# Patient Record
Sex: Female | Born: 1960 | Smoking: Never smoker
Health system: Southern US, Community
[De-identification: ages and names within clinical notes are randomized; demographics above are authoritative.]

## PROBLEM LIST (undated history)

## (undated) DIAGNOSIS — Z79899 Other long term (current) drug therapy: Secondary | ICD-10-CM

## (undated) DIAGNOSIS — J9611 Chronic respiratory failure with hypoxia: Secondary | ICD-10-CM

## (undated) DIAGNOSIS — J84112 Idiopathic pulmonary fibrosis: Secondary | ICD-10-CM

## (undated) HISTORY — DX: Chronic respiratory failure with hypoxia: J96.11

## (undated) HISTORY — DX: Idiopathic pulmonary fibrosis: J84.112

## (undated) HISTORY — DX: Other long term (current) drug therapy: Z79.899

---

## 2020-05-22 DIAGNOSIS — I5020 Unspecified systolic (congestive) heart failure: Secondary | ICD-10-CM | POA: Diagnosis not present

## 2020-05-22 DIAGNOSIS — J9621 Acute and chronic respiratory failure with hypoxia: Secondary | ICD-10-CM | POA: Diagnosis not present

## 2020-05-22 DIAGNOSIS — U071 COVID-19: Secondary | ICD-10-CM | POA: Diagnosis not present

## 2020-05-23 DIAGNOSIS — I5020 Unspecified systolic (congestive) heart failure: Secondary | ICD-10-CM | POA: Diagnosis not present

## 2020-05-23 DIAGNOSIS — J9621 Acute and chronic respiratory failure with hypoxia: Secondary | ICD-10-CM | POA: Diagnosis not present

## 2020-05-23 DIAGNOSIS — U071 COVID-19: Secondary | ICD-10-CM | POA: Diagnosis not present

## 2020-05-24 DIAGNOSIS — I5020 Unspecified systolic (congestive) heart failure: Secondary | ICD-10-CM | POA: Diagnosis not present

## 2020-05-24 DIAGNOSIS — U071 COVID-19: Secondary | ICD-10-CM | POA: Diagnosis not present

## 2020-05-24 DIAGNOSIS — J9621 Acute and chronic respiratory failure with hypoxia: Secondary | ICD-10-CM | POA: Diagnosis not present

## 2020-05-25 DIAGNOSIS — I5020 Unspecified systolic (congestive) heart failure: Secondary | ICD-10-CM | POA: Diagnosis not present

## 2020-05-25 DIAGNOSIS — U071 COVID-19: Secondary | ICD-10-CM | POA: Diagnosis not present

## 2020-05-25 DIAGNOSIS — J9621 Acute and chronic respiratory failure with hypoxia: Secondary | ICD-10-CM | POA: Diagnosis not present

## 2020-06-04 DIAGNOSIS — U071 COVID-19: Secondary | ICD-10-CM | POA: Diagnosis not present

## 2020-06-04 DIAGNOSIS — I5020 Unspecified systolic (congestive) heart failure: Secondary | ICD-10-CM | POA: Diagnosis not present

## 2020-06-04 DIAGNOSIS — J9621 Acute and chronic respiratory failure with hypoxia: Secondary | ICD-10-CM | POA: Diagnosis not present

## 2020-06-05 DIAGNOSIS — I5020 Unspecified systolic (congestive) heart failure: Secondary | ICD-10-CM | POA: Diagnosis not present

## 2020-06-05 DIAGNOSIS — U071 COVID-19: Secondary | ICD-10-CM | POA: Diagnosis not present

## 2020-06-05 DIAGNOSIS — J9621 Acute and chronic respiratory failure with hypoxia: Secondary | ICD-10-CM | POA: Diagnosis not present

## 2020-06-06 DIAGNOSIS — I5020 Unspecified systolic (congestive) heart failure: Secondary | ICD-10-CM | POA: Diagnosis not present

## 2020-06-06 DIAGNOSIS — J9621 Acute and chronic respiratory failure with hypoxia: Secondary | ICD-10-CM | POA: Diagnosis not present

## 2020-06-06 DIAGNOSIS — U071 COVID-19: Secondary | ICD-10-CM | POA: Diagnosis not present

## 2020-06-07 DIAGNOSIS — I5020 Unspecified systolic (congestive) heart failure: Secondary | ICD-10-CM | POA: Diagnosis not present

## 2020-06-07 DIAGNOSIS — J9621 Acute and chronic respiratory failure with hypoxia: Secondary | ICD-10-CM | POA: Diagnosis not present

## 2020-06-07 DIAGNOSIS — U071 COVID-19: Secondary | ICD-10-CM | POA: Diagnosis not present

## 2020-06-08 DIAGNOSIS — I5020 Unspecified systolic (congestive) heart failure: Secondary | ICD-10-CM | POA: Diagnosis not present

## 2020-06-08 DIAGNOSIS — U071 COVID-19: Secondary | ICD-10-CM | POA: Diagnosis not present

## 2020-06-08 DIAGNOSIS — J9621 Acute and chronic respiratory failure with hypoxia: Secondary | ICD-10-CM | POA: Diagnosis not present

## 2020-06-09 DIAGNOSIS — I5020 Unspecified systolic (congestive) heart failure: Secondary | ICD-10-CM | POA: Diagnosis not present

## 2020-06-09 DIAGNOSIS — U071 COVID-19: Secondary | ICD-10-CM | POA: Diagnosis not present

## 2020-06-09 DIAGNOSIS — J9621 Acute and chronic respiratory failure with hypoxia: Secondary | ICD-10-CM | POA: Diagnosis not present

## 2020-06-10 DIAGNOSIS — I5020 Unspecified systolic (congestive) heart failure: Secondary | ICD-10-CM | POA: Diagnosis not present

## 2020-06-10 DIAGNOSIS — U071 COVID-19: Secondary | ICD-10-CM | POA: Diagnosis not present

## 2020-06-10 DIAGNOSIS — J9621 Acute and chronic respiratory failure with hypoxia: Secondary | ICD-10-CM | POA: Diagnosis not present

## 2020-06-18 DIAGNOSIS — I5020 Unspecified systolic (congestive) heart failure: Secondary | ICD-10-CM | POA: Diagnosis not present

## 2020-06-18 DIAGNOSIS — J9621 Acute and chronic respiratory failure with hypoxia: Secondary | ICD-10-CM | POA: Diagnosis not present

## 2020-06-18 DIAGNOSIS — U071 COVID-19: Secondary | ICD-10-CM | POA: Diagnosis not present

## 2020-06-19 DIAGNOSIS — I5020 Unspecified systolic (congestive) heart failure: Secondary | ICD-10-CM | POA: Diagnosis not present

## 2020-06-19 DIAGNOSIS — U071 COVID-19: Secondary | ICD-10-CM | POA: Diagnosis not present

## 2020-06-19 DIAGNOSIS — J9621 Acute and chronic respiratory failure with hypoxia: Secondary | ICD-10-CM | POA: Diagnosis not present

## 2020-06-20 DIAGNOSIS — U071 COVID-19: Secondary | ICD-10-CM | POA: Diagnosis not present

## 2020-06-20 DIAGNOSIS — J9621 Acute and chronic respiratory failure with hypoxia: Secondary | ICD-10-CM | POA: Diagnosis not present

## 2020-06-20 DIAGNOSIS — I5020 Unspecified systolic (congestive) heart failure: Secondary | ICD-10-CM | POA: Diagnosis not present

## 2020-06-21 DIAGNOSIS — J9621 Acute and chronic respiratory failure with hypoxia: Secondary | ICD-10-CM | POA: Diagnosis not present

## 2020-06-21 DIAGNOSIS — U071 COVID-19: Secondary | ICD-10-CM | POA: Diagnosis not present

## 2020-06-21 DIAGNOSIS — I5020 Unspecified systolic (congestive) heart failure: Secondary | ICD-10-CM | POA: Diagnosis not present

## 2020-07-16 DIAGNOSIS — J9621 Acute and chronic respiratory failure with hypoxia: Secondary | ICD-10-CM | POA: Diagnosis not present

## 2020-07-16 DIAGNOSIS — U071 COVID-19: Secondary | ICD-10-CM | POA: Diagnosis not present

## 2020-07-16 DIAGNOSIS — I5020 Unspecified systolic (congestive) heart failure: Secondary | ICD-10-CM | POA: Diagnosis not present

## 2020-07-17 DIAGNOSIS — U071 COVID-19: Secondary | ICD-10-CM | POA: Diagnosis not present

## 2020-07-17 DIAGNOSIS — J9621 Acute and chronic respiratory failure with hypoxia: Secondary | ICD-10-CM | POA: Diagnosis not present

## 2020-07-17 DIAGNOSIS — I5020 Unspecified systolic (congestive) heart failure: Secondary | ICD-10-CM | POA: Diagnosis not present

## 2020-07-18 DIAGNOSIS — I5020 Unspecified systolic (congestive) heart failure: Secondary | ICD-10-CM | POA: Diagnosis not present

## 2020-07-18 DIAGNOSIS — J9621 Acute and chronic respiratory failure with hypoxia: Secondary | ICD-10-CM | POA: Diagnosis not present

## 2020-07-18 DIAGNOSIS — U071 COVID-19: Secondary | ICD-10-CM | POA: Diagnosis not present

## 2020-07-19 DIAGNOSIS — U071 COVID-19: Secondary | ICD-10-CM | POA: Diagnosis not present

## 2020-07-19 DIAGNOSIS — J9621 Acute and chronic respiratory failure with hypoxia: Secondary | ICD-10-CM | POA: Diagnosis not present

## 2020-07-19 DIAGNOSIS — I5020 Unspecified systolic (congestive) heart failure: Secondary | ICD-10-CM | POA: Diagnosis not present

## 2020-11-02 ENCOUNTER — Ambulatory Visit: Payer: Medicare HMO | Admitting: Pulmonary Disease

## 2020-11-02 ENCOUNTER — Encounter: Payer: Self-pay | Admitting: Pulmonary Disease

## 2020-11-02 ENCOUNTER — Telehealth: Payer: Self-pay | Admitting: Pulmonary Disease

## 2020-11-02 ENCOUNTER — Other Ambulatory Visit: Payer: Self-pay

## 2020-11-02 VITALS — BP 118/68 | HR 85 | Temp 97.6°F | Ht 66.5 in | Wt 145.2 lb

## 2020-11-02 DIAGNOSIS — R0600 Dyspnea, unspecified: Secondary | ICD-10-CM

## 2020-11-02 DIAGNOSIS — U099 Post covid-19 condition, unspecified: Secondary | ICD-10-CM | POA: Diagnosis not present

## 2020-11-02 DIAGNOSIS — G4733 Obstructive sleep apnea (adult) (pediatric): Secondary | ICD-10-CM

## 2020-11-02 NOTE — Patient Instructions (Signed)
I have reviewed your hospital course I am glad that you have improved Continue oxygen for now Stop Eliquis Thank you needed going forward  Schedule high-resolution CT and lung function test Follow-up in 1 to 2 months.

## 2020-11-02 NOTE — Telephone Encounter (Signed)
Left message for patient to call back  

## 2020-11-02 NOTE — Progress Notes (Signed)
Tracy Pace    [id number]    1961/05/13  Primary Care Physician:Kilpatrick, Greggory Stallion, MD  Referring Physician: Corine Shelter, MD [address] [place],  [place] [address]  Chief complaint: Consult for post COVID-34  HPI: 60 year old with hypertension, breast cancer in 2018 status postmastectomy, chronic headaches, OSA not on CPAP, depression  She was admitted to [facility] for pneumonia due to COVID-19 for approximately 46 days. Treated initially with steroids, remdesivir.  She had recurrent septic episodes with C. difficile, multilobar Pseudomonas pneumonia, progressive respiratory failure requiring intubation.  Ultimately had a trach placed on 05/09/2021.also had a PICC line associated right IJ DVT for which she was started on anticoagulation with heparin > transition to Eliquis.  CTA 04/03/2020 with no pulmonary embolism.  She was discharged on 05/18/2020 to [facility] and was eventually decannulated.  Discharged to home on 07/19/2021 with a Luisa device  Post discharge she has continued to make slow improvements.  Remains on supplemental oxygen at 3 and Luisa home ventilator at night.  She wants to know if she can come off some of the medications that she is on and come off the nocturnal vent, home oxygen  Pets: No pets Occupation: Disabled Exposures: No exposures Smoking history: Remote smoking history Travel history: No significant travel history Relevant family history: No family issue of lung disease  Outpatient Encounter Medications as of 11/02/2020  Medication Sig  . amiodarone (PACERONE) 100 MG tablet Take 100 mg by mouth daily.  Marland Kitchen apixaban (ELIQUIS) 5 MG TABS tablet Take 5 mg by mouth 2 (two) times daily.  . busPIRone (BUSPAR) 15 MG tablet Take by mouth.  . cetirizine (ZYRTEC) 10 MG tablet Take 10 mg by mouth daily.  . clonazePAM (KLONOPIN) 0.5 MG tablet Take 1 tablet by mouth every 8 (eight) hours as needed.  . clonazePAM (KLONOPIN) 0.5 MG tablet  Take 0.5 mg by mouth at bedtime.  . gabapentin (NEURONTIN) 300 MG capsule Give by tube.  . midodrine (PROAMATINE) 5 MG tablet Take by mouth.  . pantoprazole (PROTONIX) 40 MG tablet Take by mouth.   No facility-administered encounter medications on file as of 11/02/2020.    Allergies as of 11/02/2020  . (No Known Allergies)    No past medical history on file.  History reviewed. No pertinent surgical history.  No family history on file.  Social History   Socioeconomic History  . Marital status: Unknown    Spouse name: Not on file  . Number of children: Not on file  . Years of education: Not on file  . Highest education level: Not on file  Occupational History  . Not on file  Tobacco Use  . Smoking status: Never Smoker  . Smokeless tobacco: Never Used  Substance and Sexual Activity  . Alcohol use: Not on file  . Drug use: Not on file  . Sexual activity: Not on file  Other Topics Concern  . Not on file  Social History Narrative  . Not on file   Social Determinants of Health   Financial Resource Strain: Not on file  Food Insecurity: Not on file  Transportation Needs: Not on file  Physical Activity: Not on file  Stress: Not on file  Social Connections: Not on file  Intimate Partner Violence: Not on file    Review of systems: Review of Systems  Constitutional: Negative for fever and chills.  HENT: Negative.   Eyes: Negative for blurred vision.  Respiratory: as per HPI  Cardiovascular: Negative for chest pain and palpitations.  Gastrointestinal: Negative for vomiting, diarrhea, blood per rectum. Genitourinary: Negative for dysuria, urgency, frequency and hematuria.  Musculoskeletal: Negative for myalgias, back pain and joint pain.  Skin: Negative for itching and rash.  Neurological: Negative for dizziness, tremors, focal weakness, seizures and loss of consciousness.  Endo/Heme/Allergies: Negative for environmental allergies.  Psychiatric/Behavioral: Negative for  depression, suicidal ideas and hallucinations.  All other systems reviewed and are negative.  Physical Exam: Blood pressure 118/68, pulse 85, temperature 97.6 F (36.4 C), temperature source Temporal, height 5' 6.5" (1.689 m), weight 145 lb 3.2 oz (65.9 kg), SpO2 100 %. Gen:      No acute distress HEENT:  EOMI, sclera anicteric Neck:     No masses; no thyromegaly Lungs:    Clear to auscultation bilaterally; normal respiratory effort CV:         Regular rate and rhythm; no murmurs Abd:      + bowel sounds; soft, non-tender; no palpable masses, no distension Ext:    No edema; adequate peripheral perfusion Skin:      Warm and dry; no rash Neuro: alert and oriented x 3 Psych: normal mood and affect  Data Reviewed: Imaging: CT chest abdomen pelvis from [facility] 04/19/2020- 1. Subcutaneous emphysema neck and superior anterior chest. ETT in place.  2. Pneumomediastinum. No definite pneumothorax identified.  3. Mild increased groundglass opacities both lungs.  4. Nonspecific bowel gas pattern.  5. Couple of smallright renal stones.   Chest x-ray 05/17/2020-from [facility] 1. Stable severe bilateral lung infiltrate   PFTs:  Labs:  Cardiac: Echocardiogram 04/27/2020 from [facility] LeftVentricle: EF: 55-60%.  . Pericardium: There is no pericardial effusion.  . RightVentricle: Right ventricle is moderately dilated.  . RightVentricle: Systolic function is low normal.   Assessment:  Post COVID-19 She had a prolonged hospitalization and recovery requiring a tracheostomy Now decannulated and appears to be making slow steady progress She desatted on exertion today and will need to continue supplemental oxygen 3 L at night and with exertion  She is on [place] home vent with AVAPS and high flow setting.  She is using this intermittently and wants to get off it.  She may have needed it at discharge due to severe post covid lung disease but may be able to be weaned off.  I spoke with [name] from  [facility] who will fax a download for me to review We will schedule high-res CT and PFTs.  Based on this tests we may place an order for discontinuation of the home vent and discontinue supplemental oxygen at night  She wants to get off her other medications which include amiodarone, Revatio, Eliquis. We can stop the Eliquis as it was started in the setting of PICC line associated DVT in September during hospitalization for Covid.  I am not sure about her other medications as they were started in [facility].  She will try to get me [facility] records for review  OSA Noncompliant with CPAP.  Will need to review at return visit  Plan/Recommendations: High-res CT, PFTs Review home and download Discontinue Eliquis  Chilton Greathouse MD [facility] 11/02/2020, 11:21 AM  CC: Corine Shelter, MD

## 2020-11-06 NOTE — Telephone Encounter (Signed)
ATC pt, VM box is full. WCB. 

## 2020-11-08 NOTE — Telephone Encounter (Signed)
Called and spoke to pt. Pt requesting a POC. Pt currently uses e-tanks at 3lpm. Pt uses [facility]. Pt last seen on 11/02/2020.   Dr. Isaiah Serge, please advise if ok to place order to eval/provider pt with POC. Thanks.

## 2020-11-12 NOTE — Telephone Encounter (Signed)
Called and spoke with patient. She stated that she does not believe she received the machine from [employer]. The person who setup her machine is named [name] and he can be reached at [phone]. Advised her that I would call to see if we could get a download.   [employer]. He stated that he faxed over a copy of the download last week to Dr. Shirlee More attention. He will send another copy just in case.   Will keep encounter open on follow up for download.

## 2020-11-12 NOTE — Telephone Encounter (Signed)
I am waiting for the download from the DME company to decide on the Luisa vent. Will update her when I review this

## 2020-11-12 NOTE — Telephone Encounter (Signed)
Okay to place order for POC

## 2020-11-12 NOTE — Telephone Encounter (Signed)
Called and spoke with patient. She verbalized understanding of POC order to [facility]. While on the phone, she wanted to know if she needed to continue the [place] machine (ventilator). Reviewed her last OV and it stated that she was noncompliant and this would be addressed at her next OV. I advised her for now she would need to remain on the machine until she hears back from [place]. She verbalized understanding.   Dr. Isaiah Serge, can you advise about the ventilator. Thanks!

## 2020-11-13 NOTE — Telephone Encounter (Signed)
Misty Stanley, please advise if this has been received for Dr. Isaiah Serge. Thanks.

## 2020-11-13 NOTE — Telephone Encounter (Signed)
I have not received a fax at this time on patient. I called [name], fax number given. [name] is Diplomatic Services operational officer over to office in the morning, 11/14/20,  attention Misty Stanley. I will follow up once fax is received.

## 2020-11-14 NOTE — Telephone Encounter (Signed)
Fax received and placed with Dr. Shirlee More review.

## 2020-11-19 ENCOUNTER — Inpatient Hospital Stay: Admission: RE | Admit: 2020-11-19 | Payer: Medicare HMO | Source: Ambulatory Visit

## 2020-11-19 NOTE — Telephone Encounter (Signed)
Called and spoke with [name], he stated he has not received the paperwork back from Dr. Isaiah Serge.  Stated that if Dr. Isaiah Serge decides to d/c the machine to let him know.  Misty Stanley, please advise if Dr. Isaiah Serge has filled out the paperwork for this patient.  Please call [name] if Dr. Isaiah Serge wants to d/c the machine.  Thank you.

## 2020-11-19 NOTE — Telephone Encounter (Signed)
Per 11/14/20 encounter- I received fax and it was placed in Dr. Shirlee More review. I have not received it back from Dr. Isaiah Serge or faxed it to DME.

## 2020-11-27 NOTE — Telephone Encounter (Signed)
I called [name] at [place] to follow up on fax.  Fax was received and [name]  stated he has not received anything back at this time.[name] stated to fax if Dr. Isaiah Serge decides on d/c order.

## 2020-12-03 ENCOUNTER — Inpatient Hospital Stay: Admission: RE | Admit: 2020-12-03 | Payer: Medicare HMO | Source: Ambulatory Visit

## 2020-12-07 NOTE — Telephone Encounter (Signed)
I called and spoke with patient regarding POC machine. Informed patient that order was sent to [facility] on 11/22/2020 and we arent sure how long it will take them but need to reach out to them. Patient will also need a qual walk on POC for insurance to cover it. Patient also needed f/u OV with pft. I scheduled the patient for pft on 12/27/20 with OV with Tammy Parrett at 4pm with walk on POC. I also scheduled covid test. Patient verbalized understanding, nothing further needed.

## 2020-12-07 NOTE — Telephone Encounter (Signed)
Pt called me (she had my phone # from when I scheduled CT for her) because she has not heard anything back regarding getting poc.

## 2020-12-11 ENCOUNTER — Ambulatory Visit: Payer: Medicare HMO | Admitting: Primary Care

## 2020-12-14 ENCOUNTER — Ambulatory Visit: Payer: Medicare HMO | Admitting: Adult Health

## 2020-12-21 ENCOUNTER — Inpatient Hospital Stay: Admission: RE | Admit: 2020-12-21 | Payer: Medicare HMO | Source: Ambulatory Visit

## 2020-12-25 ENCOUNTER — Other Ambulatory Visit (HOSPITAL_COMMUNITY): Payer: Medicare HMO

## 2020-12-27 ENCOUNTER — Ambulatory Visit: Payer: Medicare HMO | Admitting: Adult Health

## 2021-01-17 ENCOUNTER — Inpatient Hospital Stay: Admission: RE | Admit: 2021-01-17 | Payer: Medicare HMO | Source: Ambulatory Visit

## 2021-01-31 ENCOUNTER — Ambulatory Visit: Payer: Medicare HMO | Admitting: Adult Health

## 2021-01-31 ENCOUNTER — Encounter: Payer: Medicare HMO | Admitting: Adult Health

## 2021-02-01 ENCOUNTER — Inpatient Hospital Stay: Admission: RE | Admit: 2021-02-01 | Payer: Medicare HMO | Source: Ambulatory Visit

## 2021-02-14 ENCOUNTER — Inpatient Hospital Stay: Admission: RE | Admit: 2021-02-14 | Payer: Medicare HMO | Source: Ambulatory Visit

## 2021-02-21 ENCOUNTER — Inpatient Hospital Stay: Admission: RE | Admit: 2021-02-21 | Payer: Medicare HMO | Source: Ambulatory Visit

## 2021-03-01 ENCOUNTER — Inpatient Hospital Stay: Admission: RE | Admit: 2021-03-01 | Payer: Medicare HMO | Source: Ambulatory Visit

## 2021-03-01 ENCOUNTER — Ambulatory Visit: Payer: Medicare HMO | Admitting: Adult Health

## 2021-03-12 ENCOUNTER — Ambulatory Visit (INDEPENDENT_AMBULATORY_CARE_PROVIDER_SITE_OTHER)
Admission: RE | Admit: 2021-03-12 | Discharge: 2021-03-12 | Disposition: A | Discharge status: Home / Self Care | Payer: Medicare HMO | Source: Ambulatory Visit | Attending: Pulmonary Disease | Admitting: Pulmonary Disease

## 2021-03-12 ENCOUNTER — Other Ambulatory Visit: Payer: Self-pay

## 2021-03-12 DIAGNOSIS — R0600 Dyspnea, unspecified: Secondary | ICD-10-CM

## 2021-03-21 ENCOUNTER — Telehealth: Payer: Self-pay | Admitting: Pulmonary Disease

## 2021-03-21 NOTE — Telephone Encounter (Signed)
Call made to patient, confirmed DOB. Made aware of CT results per PM:    Voiced understanding. Nothing further needed at this time.

## 2021-03-28 ENCOUNTER — Ambulatory Visit: Payer: Medicare HMO | Admitting: Adult Health

## 2021-04-22 ENCOUNTER — Ambulatory Visit: Payer: Medicare HMO | Admitting: Adult Health

## 2021-04-22 ENCOUNTER — Telehealth: Payer: Self-pay | Admitting: Pulmonary Disease

## 2021-04-22 DIAGNOSIS — G4733 Obstructive sleep apnea (adult) (pediatric): Secondary | ICD-10-CM

## 2021-04-22 DIAGNOSIS — U099 Post covid-19 condition, unspecified: Secondary | ICD-10-CM

## 2021-04-22 DIAGNOSIS — R0600 Dyspnea, unspecified: Secondary | ICD-10-CM

## 2021-04-22 NOTE — Telephone Encounter (Signed)
Called and spoke with patient who states that she needs an order sent to [facility] so that they can come and pick up her oxygen because she has not used it in months now.   Dr. Isaiah Serge please advise if you are ok with [place] placing order

## 2021-04-23 NOTE — Telephone Encounter (Signed)
Please advise if you are ok with [place] putting in order to D/C oxygen

## 2021-04-23 NOTE — Telephone Encounter (Signed)
Patient states fax number for [facility] is [phone]. Discharge on NIV. Patient phone number is [phone].

## 2021-04-24 NOTE — Telephone Encounter (Signed)
Okay to discontinue oxygen

## 2021-04-24 NOTE — Telephone Encounter (Signed)
I called and spoke with patient regarding Dr. Isaiah Serge recs to D/C oxygen. Patient verbalized understanding, sent in order. Nothing further needed.

## 2021-05-10 ENCOUNTER — Ambulatory Visit: Payer: Medicare HMO | Admitting: Adult Health

## 2021-06-14 ENCOUNTER — Ambulatory Visit: Payer: Medicare HMO | Admitting: Adult Health

## 2021-08-06 ENCOUNTER — Ambulatory Visit: Payer: Medicare HMO | Admitting: Adult Health

## 2021-08-22 ENCOUNTER — Ambulatory Visit: Payer: Medicare HMO | Admitting: Adult Health

## 2021-09-13 ENCOUNTER — Other Ambulatory Visit: Payer: Self-pay

## 2021-09-13 ENCOUNTER — Encounter: Payer: Self-pay | Admitting: Adult Health

## 2021-09-13 ENCOUNTER — Ambulatory Visit: Payer: Medicare HMO | Admitting: Adult Health

## 2021-09-13 ENCOUNTER — Ambulatory Visit (INDEPENDENT_AMBULATORY_CARE_PROVIDER_SITE_OTHER): Payer: Medicare HMO | Admitting: Pulmonary Disease

## 2021-09-13 VITALS — BP 122/80 | HR 88

## 2021-09-13 DIAGNOSIS — J9611 Chronic respiratory failure with hypoxia: Secondary | ICD-10-CM | POA: Diagnosis not present

## 2021-09-13 DIAGNOSIS — R0600 Dyspnea, unspecified: Secondary | ICD-10-CM

## 2021-09-13 DIAGNOSIS — J84112 Idiopathic pulmonary fibrosis: Secondary | ICD-10-CM

## 2021-09-13 DIAGNOSIS — J8410 Pulmonary fibrosis, unspecified: Secondary | ICD-10-CM | POA: Insufficient documentation

## 2021-09-13 DIAGNOSIS — Z79899 Other long term (current) drug therapy: Secondary | ICD-10-CM

## 2021-09-13 LAB — PULMONARY FUNCTION TEST
DL/VA % pred: 45 %
DL/VA: 1.91 ml/min/mmHg/L
DLCO cor % pred: 32 %
DLCO cor: 6.75 ml/min/mmHg
DLCO unc % pred: 32 %
DLCO unc: 6.75 ml/min/mmHg
FEF 25-75 Post: 5.71 L/sec
FEF 25-75 Pre: 5.26 L/sec
FEF2575-%Change-Post: 8 %
FEF2575-%Pred-Post: 245 %
FEF2575-%Pred-Pre: 226 %
FEV1-%Change-Post: 1 %
FEV1-%Pred-Post: 85 %
FEV1-%Pred-Pre: 84 %
FEV1-Post: 2.22 L
FEV1-Pre: 2.19 L
FEV1FVC-%Change-Post: 0 %
FEV1FVC-%Pred-Pre: 116 %
FEV6-%Change-Post: 1 %
FEV6-%Pred-Post: 75 %
FEV6-%Pred-Pre: 74 %
FEV6-Post: 2.46 L
FEV6-Pre: 2.42 L
FEV6FVC-%Pred-Post: 103 %
FEV6FVC-%Pred-Pre: 103 %
FVC-%Change-Post: 1 %
FVC-%Pred-Post: 72 %
FVC-%Pred-Pre: 71 %
FVC-Post: 2.46 L
FVC-Pre: 2.42 L
Post FEV1/FVC ratio: 90 %
Post FEV6/FVC ratio: 100 %
Pre FEV1/FVC ratio: 90 %
Pre FEV6/FVC Ratio: 100 %

## 2021-09-13 NOTE — Progress Notes (Signed)
@Patient  ID: Tracy Pace, female    DOB: 1960/11/29, 61 y.o.   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  She had a very complicated hospitalization with C. difficile, multi lobar Pseudomonas pneumonia, right IJ DVT secondary to PICC line she was initially admitted at [facility].  Then transferred to [facility].  Required oxygen and nocturnal noninvasive vent.   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  She was last seen April 2022.  She was seen for pulmonary consult at that visit.  As above patient had severe critical illness September 2021 and was admitted to [facility].  She had severe COVID-19 infection, multi lobar pneumonia acute respiratory failure.  She had prolonged hospitalization requiring vent support and eventual tracheostomy.  She was transferred to [facility] and had eventual decannulation.  Her hospital course was complicated by C. difficile, Multi lobar pneumonia with Pseudomonas.  Right IJ DVT secondary to PICC line.  Patient was discharged on oxygen and noninvasive vent support.   Last visit patient was set up for a high-resolution CT chest that showed bilateral traction bronchiectasis, reticular opacities with areas of groundglass O2 opacities.  No evidence of honeycombing compatible with a post COVID fibrosis. She  returns today for PFTs.  Which showed mild to moderate restriction with an FEV1 at 85%, ratio 90, FVC 72%, no significant bronchodilator response, DLCO 32%. She remains on oxygen 2 L. Returned the [place] device in June 2022. Remains on Oxygen 2l/m 24/7.  She says over the last several months her oxygen demands have decreased she is now able to be off of oxygen with sitting with her O2 saturations are maintaining above 90% however if she walks she does desaturate into the 80s and needs oxygen at 2 L to keep O2 saturations greater than 90%.  She does have a POC device when she is out from her home.  Patient says she does not have any significant shortness of breath at rest.  She does get short of breath with heavy activities.  But her activity tolerance has picked up some. Patient is on amiodarone.  She is unsure if she had A-fib during her hospitalization. Has been on Amiodarone for last year. Has not seen cardiology .  Lives at home. Does not drive since Covid . Daughter lives with her. Never smoker.    No Known Allergies  Immunization History  Administered Date(s) Administered   PFIZER(Purple Top)SARS-COV-2 Vaccination 09/07/2020    No past medical history on file.  Tobacco History: Social History   Tobacco Use  Smoking Status Never  Smokeless Tobacco Never   Counseling given: Not Answered   Outpatient Medications Prior to Visit  Medication Sig Dispense Refill   amiodarone (PACERONE) 100 MG tablet Take 100 mg by mouth daily.     ARIPiprazole (ABILIFY) 10 MG tablet Take 10 mg by mouth daily.     azelastine (ASTELIN) 0.1 % nasal spray Use 1 spray(s) in each nostril twice daily  busPIRone (BUSPAR) 15 MG tablet Take 1 tablet by mouth 2 (two) times daily.     gabapentin (NEURONTIN) 300 MG capsule Give by tube.     hydrOXYzine (VISTARIL) 25 MG capsule TAKE 1 CAPSULE BY MOUTH TWICE DAILY AS NEEDED FOR ANXIETY     metoprolol tartrate (LOPRESSOR) 25 MG tablet Take 12.5 mg by mouth daily.      nortriptyline (PAMELOR) 25 MG capsule Take 1 capsule by mouth at bedtime.     sertraline (ZOLOFT) 50 MG tablet Take 150 mg by mouth at bedtime.     sildenafil (REVATIO) 20 MG tablet TAKE 1/2 (ONE-HALF) TABLET BY MOUTH EVERY 12 HOURS     traMADol (ULTRAM) 50 MG tablet Take by mouth.     pantoprazole (PROTONIX) 40 MG tablet Take by mouth.     apixaban (ELIQUIS) 5 MG TABS tablet Take 5 mg by mouth 2 (two) times daily. (Patient not taking: Reported on 09/13/2021)     cetirizine (ZYRTEC) 10 MG tablet Take 10 mg by mouth daily. (Patient not taking: Reported on 09/13/2021)     clonazePAM (KLONOPIN) 0.5 MG tablet Take 1 tablet by mouth every 8 (eight) hours as needed. (Patient not taking: Reported on 09/13/2021)     clonazePAM (KLONOPIN) 0.5 MG tablet Take 0.5 mg by mouth at bedtime. (Patient not taking: Reported on 09/13/2021)     No facility-administered medications prior to visit.     Review of Systems:   Constitutional:   No  weight loss, night sweats,  Fevers, chills,  +fatigue, or  lassitude.  HEENT:   No headaches,  Difficulty swallowing,  Tooth/dental problems, or  Sore throat,                No sneezing, itching, ear ache, nasal congestion, post nasal drip,   CV:  No chest pain,  Orthopnea, PND, swelling in lower extremities, anasarca, dizziness, palpitations, syncope.   GI  No heartburn, indigestion, abdominal pain, nausea, vomiting, diarrhea, change in bowel habits, loss of appetite, bloody stools.   Resp:  No chest wall deformity  Skin: no rash or lesions.  GU: no dysuria, change in color of urine, no urgency or frequency.  No flank pain, no hematuria   MS:  No joint pain or swelling.  No decreased range of motion.  No back pain.    Physical Exam  BP 122/80 (BP Location: Left Arm, Cuff Size: Normal)    Pulse 88    SpO2 90%   GEN: A/Ox3; pleasant , NAD, well nourished    HEENT:  [place]/AT,  NOSE-clear, THROAT-clear, no lesions, no postnasal drip or exudate noted.   NECK:   Supple w/ fair ROM; no JVD; normal carotid impulses w/o bruits; no thyromegaly or nodules palpated; no lymphadenopathy.    RESP  Faint BB crackles   no accessory muscle use, no dullness to percussion  CARD:  RRR, no m/r/g, no peripheral edema, pulses intact, no cyanosis or clubbing.  GI:   Soft & nt; nml bowel sounds; no organomegaly or masses detected.   Musco: Warm bil, no deformities or joint swelling noted.   Neuro: alert, no focal deficits noted.    Skin: Warm, no lesions or rashes    Lab Results:  CBC No results found for: WBC, RBC, HGB, HCT, PLT, MCV, MCH, MCHC, RDW, LYMPHSABS, MONOABS, EOSABS, BASOSABS  BMET No results found for: NA, K, CL, CO2, GLUCOSE, BUN, CREATININE, CALCIUM, GFRNONAA, GFRAA  BNP No results found for: BNP  ProBNP No results  found for: PROBNP  Imaging: No results found.    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  Clinically she is making positive progress with decreased oxygen demands.  But continues to be quite debilitated with physical deconditioning decreased activity tolerance and ongoing oxygen dependence. Pulmonary function testing today shows mild to moderate restriction with a severe diffusing defect. Patient is on amiodarone-Will refer to cardiology if she is able to come off of amiodarone that would be ideal as it has potential pulmonary toxicity.  Referred to pulmonary rehab  Plan   Patient Instructions  Continue on Oxygen 2l/m , goal to keep Oxygen sats >88-90%.  Refer to pulmonary rehab.  Refer to cardiology  Follow up with Dr. Vaughan Browner in 3 months and As needed         Long term current use of amiodarone Suspect patient had atrial fibrillation during critical hospitalization however unable to find documentation.  She has been on amiodarone for greater than 1 year.  Will refer to cardiology for follow-up.  If able to discontinue would be ideal as she has underlying significant postinflammatory fibrosis.   Chronic respiratory failure with hypoxia (HCC) Patient remains oxygen dependent her oxygen demands have decreased over the last year.  She continues to require 2 L with activity.  Plan . Patient Instructions  Continue on Oxygen 2l/m , goal to keep Oxygen sats >88-90%.  Refer to pulmonary rehab.  Refer to cardiology  Follow up with Dr. Vaughan Browner in 3 months and As needed           Rexene Edison, NP 09/13/2021

## 2021-09-13 NOTE — Assessment & Plan Note (Signed)
Patient has extensive postinflammatory pulmonary fibrosis from severe COVID-19 infection with multi lobar pneumonia.  Clinically she is making positive progress with decreased oxygen demands.  But continues to be quite debilitated with physical deconditioning decreased activity tolerance and ongoing oxygen dependence. Pulmonary function testing today shows mild to moderate restriction with a severe diffusing defect. Patient is on amiodarone-Will refer to cardiology if she is able to come off of amiodarone that would be ideal as it has potential pulmonary toxicity.  Referred to pulmonary rehab  Plan  Patient Instructions  Continue on Oxygen 2l/m , goal to keep Oxygen sats >88-90%.  Refer to pulmonary rehab.  Refer to cardiology  Follow up with Dr. Vaughan Browner in 3 months and As needed

## 2021-09-13 NOTE — Assessment & Plan Note (Signed)
Patient remains oxygen dependent her oxygen demands have decreased over the last year.  She continues to require 2 L with activity.  Plan . Patient Instructions  Continue on Oxygen 2l/m , goal to keep Oxygen sats >88-90%.  Refer to pulmonary rehab.  Refer to cardiology  Follow up with Dr. Isaiah Serge in 3 months and As needed

## 2021-09-13 NOTE — Progress Notes (Signed)
PFT done today. 

## 2021-09-13 NOTE — Assessment & Plan Note (Signed)
Suspect patient had atrial fibrillation during critical hospitalization however unable to find documentation.  She has been on amiodarone for greater than 1 year.  Will refer to cardiology for follow-up.  If able to discontinue would be ideal as she has underlying significant postinflammatory fibrosis.

## 2021-09-13 NOTE — Patient Instructions (Addendum)
Continue on Oxygen 2l/m , goal to keep Oxygen sats >88-90%.  Refer to pulmonary rehab.  Refer to cardiology  Follow up with Dr. Isaiah Serge in 3 months and As needed

## 2021-09-23 ENCOUNTER — Telehealth: Payer: Self-pay | Admitting: Adult Health

## 2021-09-23 NOTE — Telephone Encounter (Signed)
Called patient but she did not answer. I did not see any phone notes from our office in her chart. Left message for her to call us back.

## 2021-10-15 ENCOUNTER — Ambulatory Visit: Payer: Medicare HMO | Admitting: Interventional Cardiology

## 2021-11-15 IMAGING — CT CT CHEST HIGH RESOLUTION W/O CM
2 of 7 series · 14 of 36 positions shown, 17 images · non-contrast
Comparison: None.

CLINICAL DATA: Shortness of breath, evaluate for ild, history of
COVID

EXAM:
CT CHEST WITHOUT CONTRAST
TECHNIQUE: Multidetector CT imaging of the chest was performed following the
standard protocol without intravenous contrast. High resolution
imaging of the lungs, as well as inspiratory and expiratory imaging,
was performed.

[Series 4: high resolution · axial · 0.60mm/px · z∈[-276,-52]mm · 11 of 271 slices shown, 14 images]
[im 23/271  mediastinal]
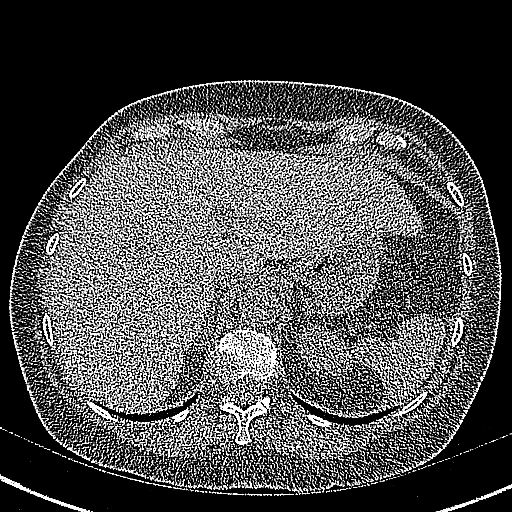
[im 23/271  lung]
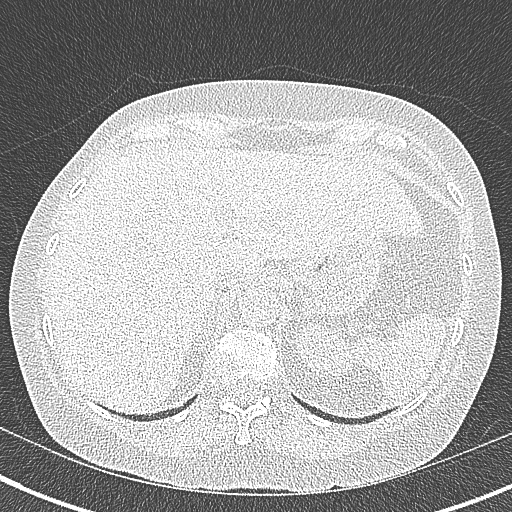
[im 46/271  lung]
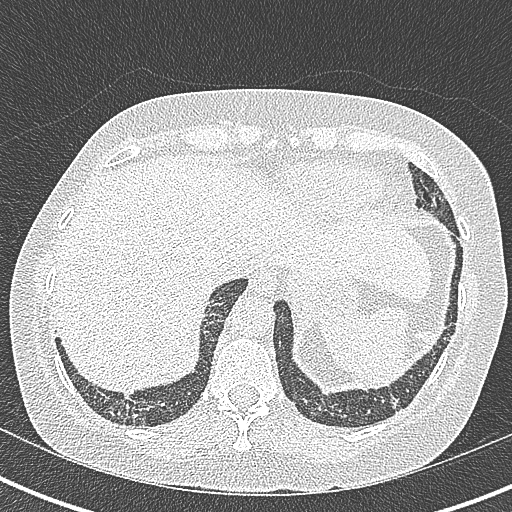
[im 68/271  lung]
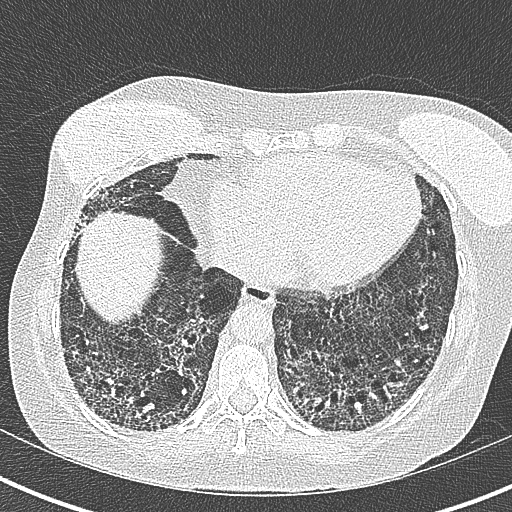
[im 91/271  lung]
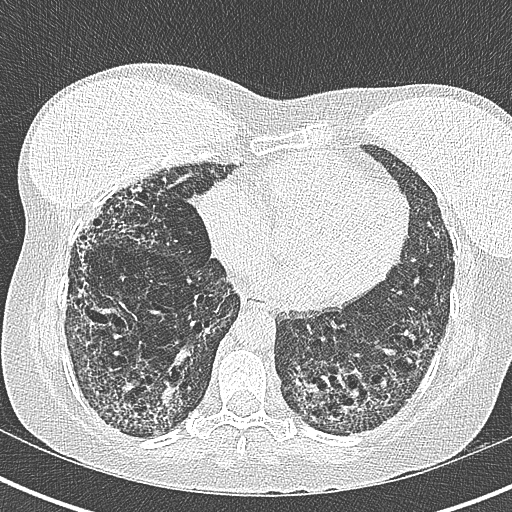
[im 113/271  mediastinal]
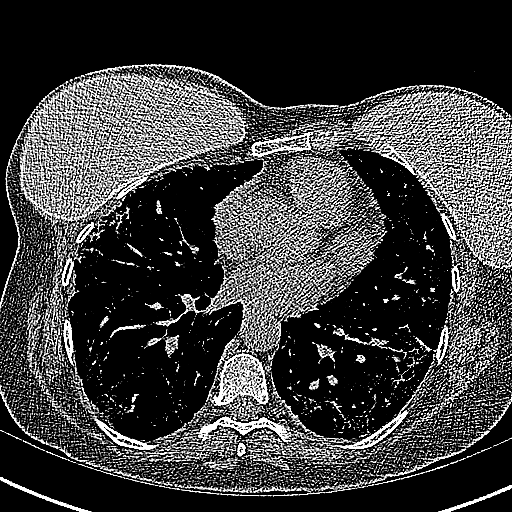
[im 113/271  lung]
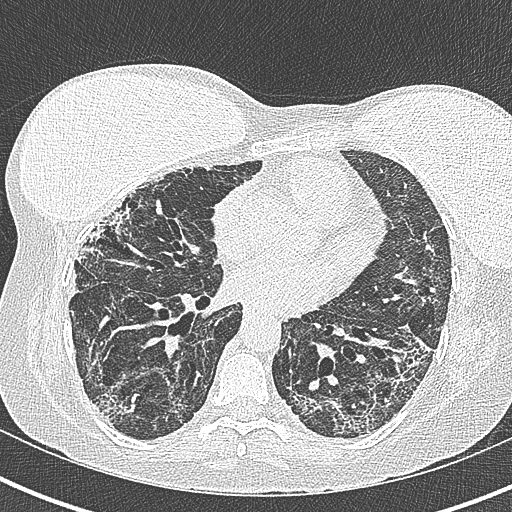
[im 136/271  lung]
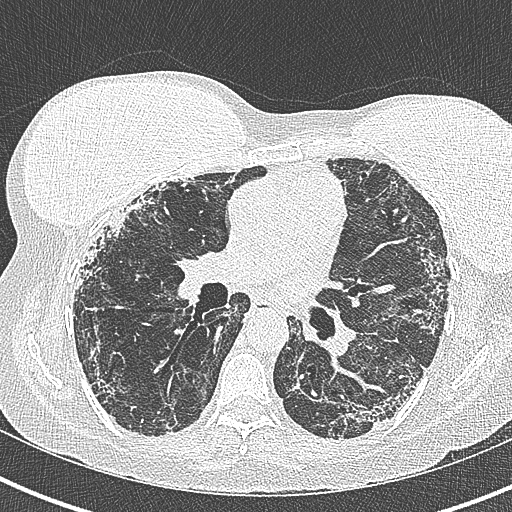
[im 158/271  lung]
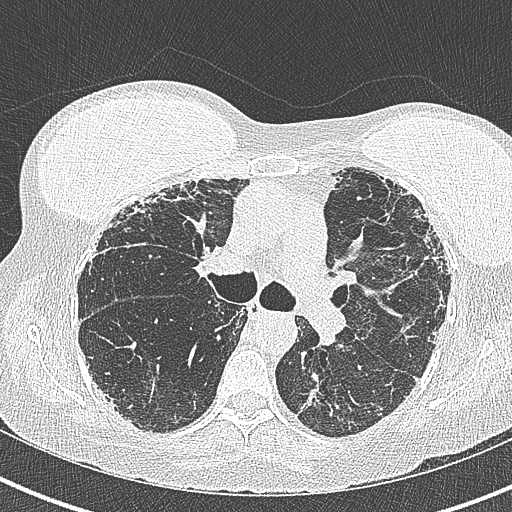
[im 181/271  lung]
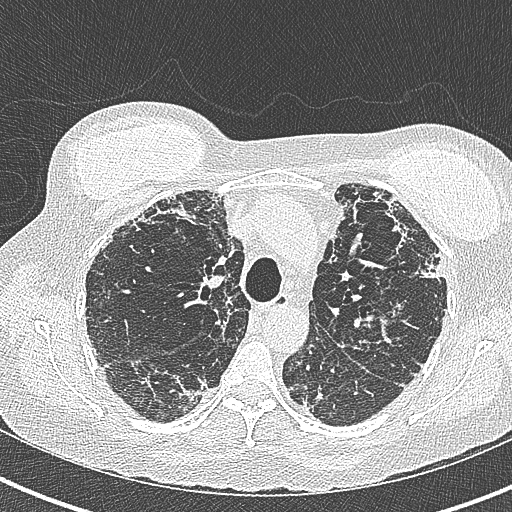
[im 203/271  mediastinal]
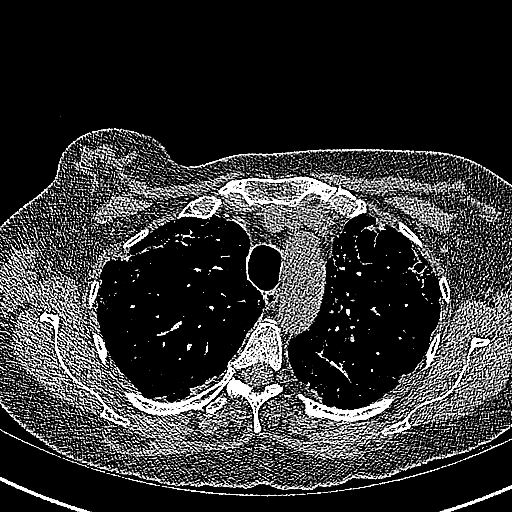
[im 203/271  lung]
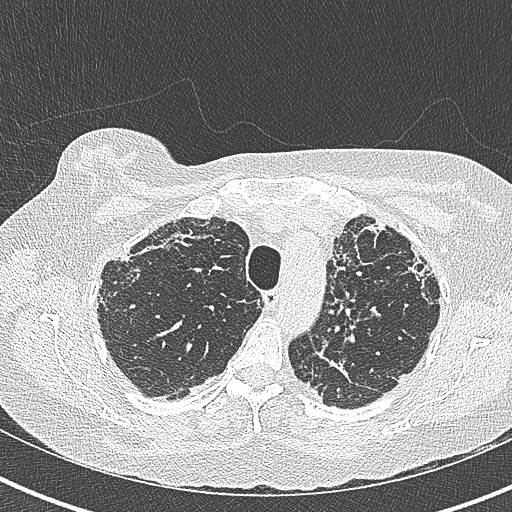
[im 226/271  lung]
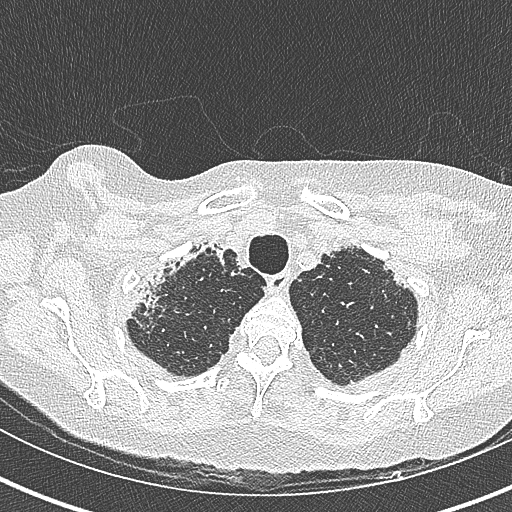
[im 248/271  lung]
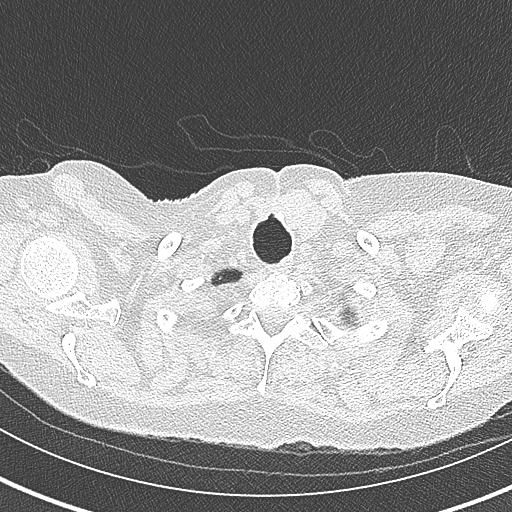

[Series 6: coronal · coronal · 0.53mm/px · 3 of 109 slices shown]
[im 22/109  lung]
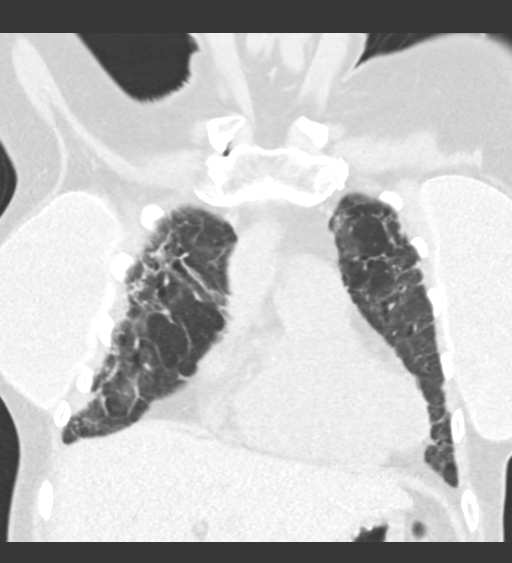
[im 44/109  lung]
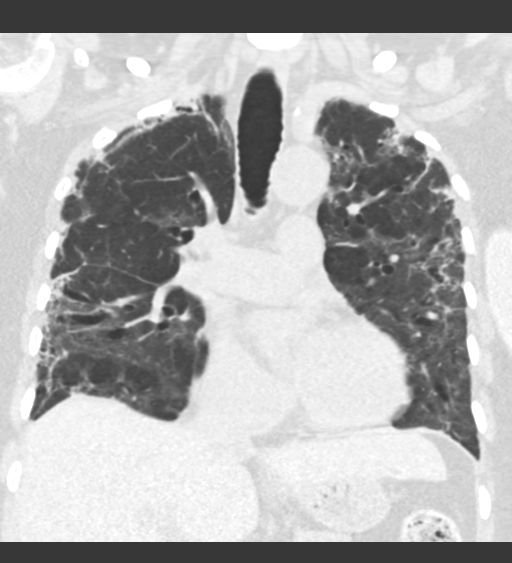
[im 65/109  lung]
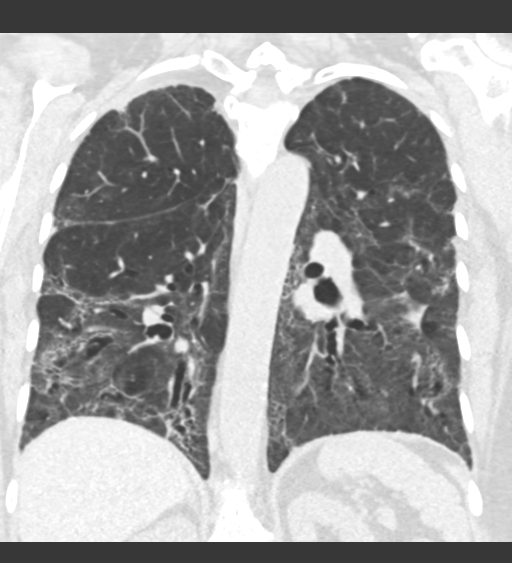

[14 of 36 positions shown; findings below may reference images not displayed]

FINDINGS: Cardiovascular: No pericardial effusion. Mild calcifications of the
LAD. Mild calcified plaque of the thoracic aorta.

Mediastinum/Nodes: No pathologically enlarged lymph nodes in the
chest. Normal esophagus and thyroid.

Lungs/Pleura: Bilateral traction bronchiectasis. Peribronchovascular
predominant reticular opacities with areas of ground-glass most
prominent in the mid/lower lung zone periphery. No evidence of
honeycomb change. Scattered bilateral areas of consolidation, likely
focal fibrosis. No evidence of significant air trapping.

Upper Abdomen: Nonobstructing right renal stone. No acute findings
in the abdomen.

Musculoskeletal: No chest wall mass or suspicious bone lesions
identified.
IMPRESSION: Findings are compatible with post COVID fibrosis, NSIP is an
additional consideration. Findings are suggestive of an alternative
diagnosis (not UIP) per consensus guidelines: Diagnosis of
Idiopathic Pulmonary Fibrosis: An Official ATS/ERS/JRS/ALAT Clinical
Practice Guideline. Am J Respir Crit Care Med Vol 198, Tarip 5,
ppe44-e[DATE].

Aortic Atherosclerosis (OE8XS-K05.5).

## 2023-09-03 ENCOUNTER — Emergency Department (HOSPITAL_COMMUNITY): Payer: No Typology Code available for payment source

## 2023-09-03 ENCOUNTER — Emergency Department (HOSPITAL_COMMUNITY)
Admission: EM | Admit: 2023-09-03 | Discharge: 2023-09-04 | Disposition: A | Discharge status: Home / Self Care | Payer: No Typology Code available for payment source | Attending: Emergency Medicine | Admitting: Emergency Medicine

## 2023-09-03 ENCOUNTER — Encounter (HOSPITAL_COMMUNITY): Payer: Self-pay | Admitting: Emergency Medicine

## 2023-09-03 DIAGNOSIS — D72829 Elevated white blood cell count, unspecified: Secondary | ICD-10-CM | POA: Insufficient documentation

## 2023-09-03 DIAGNOSIS — R1030 Lower abdominal pain, unspecified: Secondary | ICD-10-CM | POA: Diagnosis present

## 2023-09-03 DIAGNOSIS — R110 Nausea: Secondary | ICD-10-CM | POA: Diagnosis not present

## 2023-09-03 LAB — COMPREHENSIVE METABOLIC PANEL
ALT: 16 U/L (ref 0–44)
AST: 21 U/L (ref 15–41)
Albumin: 4.2 g/dL (ref 3.5–5.0)
Alkaline Phosphatase: 66 U/L (ref 38–126)
Anion gap: 13 (ref 5–15)
BUN: 13 mg/dL (ref 8–23)
CO2: 21 mmol/L — ABNORMAL LOW (ref 22–32)
Calcium: 9.5 mg/dL (ref 8.9–10.3)
Chloride: 103 mmol/L (ref 98–111)
Creatinine, Ser: 0.97 mg/dL (ref 0.44–1.00)
GFR, Estimated: >60 mL/min (ref >60)
Glucose, Bld: 80 mg/dL (ref 70–99)
Potassium: 4 mmol/L (ref 3.5–5.1)
Sodium: 137 mmol/L (ref 135–145)
Total Bilirubin: 0.8 mg/dL (ref 0.0–1.2)
Total Protein: 7.9 g/dL (ref 6.5–8.1)

## 2023-09-03 LAB — URINALYSIS, ROUTINE W REFLEX MICROSCOPIC
Bilirubin Urine: NEGATIVE
Glucose, UA: NEGATIVE mg/dL
Hgb urine dipstick: NEGATIVE
Ketones, ur: 5 mg/dL — AB
Leukocytes,Ua: NEGATIVE
Nitrite: NEGATIVE
Protein, ur: NEGATIVE mg/dL
Specific Gravity, Urine: 1.008 (ref 1.005–1.030)
pH: 5 (ref 5.0–8.0)

## 2023-09-03 LAB — CBC
HCT: 34.1 % — ABNORMAL LOW (ref 36.0–46.0)
Hemoglobin: 11.1 g/dL — ABNORMAL LOW (ref 12.0–15.0)
MCH: 30.2 pg (ref 26.0–34.0)
MCHC: 32.6 g/dL (ref 30.0–36.0)
MCV: 92.7 fL (ref 80.0–100.0)
Platelets: 372 10*3/uL (ref 150–400)
RBC: 3.68 MIL/uL — ABNORMAL LOW (ref 3.87–5.11)
RDW: 12.9 % (ref 11.5–15.5)
WBC: 11.1 10*3/uL — ABNORMAL HIGH (ref 4.0–10.5)
nRBC: 0 % (ref 0.0–0.2)

## 2023-09-03 LAB — LIPASE, BLOOD: Lipase: 24 U/L (ref 11–51)

## 2023-09-03 MED ORDER — ONDANSETRON HCL 4 MG/2ML IJ SOLN
4.0000 mg | Freq: Once | INTRAMUSCULAR | Status: AC
  Administered 2023-09-03: 4 mg via INTRAVENOUS
  Filled 2023-09-03: qty 2

## 2023-09-03 MED ORDER — FENTANYL CITRATE PF 50 MCG/ML IJ SOSY
50.0000 ug | PREFILLED_SYRINGE | Freq: Once | INTRAMUSCULAR | Status: AC
  Administered 2023-09-03: 50 ug via INTRAVENOUS
  Filled 2023-09-03: qty 1

## 2023-09-03 MED ORDER — IOHEXOL 300 MG/ML  SOLN
100.0000 mL | Freq: Once | INTRAMUSCULAR | Status: AC | PRN
  Administered 2023-09-03: 100 mL via INTRAVENOUS

## 2023-09-03 NOTE — ED Provider Notes (Signed)
 [place] EMERGENCY DEPARTMENT AT [facility] Provider Note   CSN: [id number] Arrival date & time: 09/03/23  1346     History  Chief Complaint  Patient presents with   Abdominal Pain    Tracy Pace is a 63 y.o. female who presents emergency department chief complaint of lower abdominal pain.  She reports this has been going on for a couple of weeks.  Previously she had intermittent pain but now constant for the last 48 hours.  She describes the pain as severe, sharp, unrelenting.  She denies urinary or bowel symptoms.  She has had nausea without vomiting.  She has a past medical history of chronic hypoxic respiratory failure on 2 L to idiopathic pulmonary fibrosis and long-term use of amiodarone.   Abdominal Pain      Home Medications Prior to Admission medications   Medication Sig Start Date End Date Taking? Authorizing Provider  amiodarone (PACERONE) 100 MG tablet Take 100 mg by mouth daily.    [provider]  ARIPiprazole (ABILIFY) 10 MG tablet Take 10 mg by mouth daily. 08/19/21   [provider]  azelastine (ASTELIN) 0.1 % nasal spray Use 1 spray(s) in each nostril twice daily 05/20/21   [provider]  busPIRone (BUSPAR) 15 MG tablet Take 1 tablet by mouth 2 (two) times daily. 09/03/21   [provider]  gabapentin (NEURONTIN) 300 MG capsule Give by tube. 05/18/20   [provider]  hydrOXYzine (VISTARIL) 25 MG capsule TAKE 1 CAPSULE BY MOUTH TWICE DAILY AS NEEDED FOR ANXIETY 08/09/21   [provider]  metoprolol tartrate (LOPRESSOR) 25 MG tablet Take 12.5 mg by mouth daily. 07/29/21   [provider]  nortriptyline (PAMELOR) 25 MG capsule Take 1 capsule by mouth at bedtime. 10/13/19   [provider]  pantoprazole (PROTONIX) 40 MG tablet Take by mouth. 08/06/20 08/06/21  [provider]  sertraline (ZOLOFT) 50 MG tablet Take 150 mg by mouth at bedtime. 09/03/21   [provider]   sildenafil (REVATIO) 20 MG tablet TAKE 1/2 (ONE-HALF) TABLET BY MOUTH EVERY 12 HOURS 08/19/21   [provider]      Allergies    Patient has no known allergies.    Review of Systems   Review of Systems  Gastrointestinal:  Positive for abdominal pain.    Physical Exam Updated Vital Signs BP (!) 141/94 (BP Location: Left Arm)   Pulse 72   Temp 97.6 F (36.4 C) (Oral)   Resp 16   Ht 5' 6 (1.676 m)   Wt 65.8 kg   SpO2 96%   BMI 23.40 kg/m  Physical Exam Vitals and nursing note reviewed.  Constitutional:      General: She is not in acute distress.    Appearance: She is well-developed. She is not diaphoretic.  HENT:     Head: Normocephalic and atraumatic.     Right Ear: External ear normal.     Left Ear: External ear normal.     Nose: Nose normal.     Mouth/Throat:     Mouth: Mucous membranes are moist.  Eyes:     General: No scleral icterus.    Conjunctiva/sclera: Conjunctivae normal.  Cardiovascular:     Rate and Rhythm: Normal rate and regular rhythm.     Heart sounds: Normal heart sounds. No murmur heard.    No friction rub. No gallop.  Pulmonary:     Effort: Pulmonary effort is normal. No respiratory distress.  Breath sounds: Normal breath sounds.  Abdominal:     General: Bowel sounds are normal. There is no distension.     Palpations: Abdomen is soft. There is no mass.     Tenderness: There is no abdominal tenderness. There is guarding.       Comments: Tenderness and involuntary guarding across the lower abdominal region  Musculoskeletal:     Cervical back: Normal range of motion.  Skin:    General: Skin is warm and dry.  Neurological:     Mental Status: She is alert and oriented to person, place, and time.  Psychiatric:        Behavior: Behavior normal.     ED Results / Procedures / Treatments   Labs (all labs ordered are listed, but only abnormal results are displayed) Labs Reviewed  COMPREHENSIVE METABOLIC PANEL - Abnormal; Notable  for the following components:      Result Value   CO2 21 (*)    All other components within normal limits  CBC - Abnormal; Notable for the following components:   WBC 11.1 (*)    RBC 3.68 (*)    Hemoglobin 11.1 (*)    HCT 34.1 (*)    All other components within normal limits  URINALYSIS, ROUTINE W REFLEX MICROSCOPIC - Abnormal; Notable for the following components:   Color, Urine STRAW (*)    Ketones, ur 5 (*)    All other components within normal limits  LIPASE, BLOOD    EKG None  Radiology No results found.  Procedures Procedures    Medications Ordered in ED Medications  fentaNYL  (SUBLIMAZE ) injection 50 mcg (has no administration in time range)  ondansetron  (ZOFRAN ) injection 4 mg (has no administration in time range)    ED Course/ Medical Decision Making/ A&P                                 Medical Decision Making Amount and/or Complexity of Data Reviewed Radiology: ordered.  Risk Prescription drug management.   This patient presents to the ED with chief complaint(s) of abdominal pain with pertinent past medical history of idiopathic pulmonary fibrosis which further complicates the presenting complaint. The complaint involves an extensive differential diagnosis and treatment options and also carries with it a high risk of complications and morbidity.    The differential diagnosis includes The differential diagnosis for generalized abdominal pain includes, but is not limited to AAA, gastroenteritis, appendicitis, Bowel obstruction, Bowel perforation. Gastroparesis, DKA, Hernia, Inflammatory bowel disease, mesenteric ischemia, pancreatitis, peritonitis SBP, volvulus.    The initial plan is to order CT abdomen and pelvis and to treat patient with pain and antiemetics for abdominal pain      Reassessment and review (also see workup area): Lab Tests: I Ordered, and personally interpreted labs.  The pertinent results include: I reviewed the patient's labs with  elevated white blood cell count at 11.1.  Lipase within normal limits, CMP without significant abnormality.  Urine is negative for infection.  Imaging Studies: I ordered and independently visualized and interpreted the following imaging CT scan abd/pv   which showed no acute findings The interpretation of the imaging was limited to assessing for emergent pathology, for which purpose it was ordered.   Complexity of problems addressed: Patient's presentation is most consistent with  acute, uncomplicated illness During patient's assessment  Disposition: After consideration of the diagnostic results and the patient's response to treatment,  I feel that  the patent would benefit from discharge with op follow up. .    Patient here with lower abd pain . No acute cause found at this time. PDMP reviewed during this encounter. Follow up with pcp.        Final Clinical Impression(s) / ED Diagnoses Final diagnoses:  None    Rx / DC Orders ED Discharge Orders     None         Arloa Chroman, PA-C 09/07/23 1456    Raford Lenis, MD 09/08/23 [phone]

## 2023-09-03 NOTE — ED Triage Notes (Signed)
 Pt here from home with c/o lower bil abd pain that has been ongoing for 2 weeks but has got worse over the last two days

## 2023-09-03 NOTE — ED Provider Triage Note (Signed)
 Emergency Medicine Provider Triage Evaluation Note  Tracy Pace , a 63 y.o. female  was evaluated in triage.  Pt complains of lower abd pain for 2 weeks and worsening over the past two days.  Has recently had urinary stones but then had a CT scan early January that showed her stones were gone (in [facility]).  Normal Bms, no urinary sx. No fevers.   No abn vaginal DC but does endorse vaginal pain.   Review of Systems  Positive: Vaginal discomfort and suprapubic discomfort Negative: CP, SOB  Physical Exam  BP (!) 177/82 (BP Location: Right Arm)   Pulse 98   Temp 97.7 F (36.5 C) (Oral)   Resp 18   Ht 5' 6 (1.676 m)   Wt 65.8 kg   SpO2 94%   BMI 23.40 kg/m  Gen:   Awake, no distress   Resp:  Normal effort  MSK:   Moves extremities without difficulty  Other:  Some suprapubic discomfort with palpation  Medical Decision Making  Medically screening exam initiated at 4:19 PM.  Appropriate orders placed.  Tracy Pace was informed that the remainder of the evaluation will be completed by another provider, this initial triage assessment does not replace that evaluation, and the importance of remaining in the ED until their evaluation is complete.  Non-toxic, some suprapubic discomfort. Will start with labs/urine.    Tracy Pace [place], [place] 09/03/23 1622

## 2023-09-04 MED ORDER — DICYCLOMINE HCL 20 MG PO TABS: 20.0000 mg | ORAL_TABLET | Freq: Two times a day (BID) | ORAL | 0 refills | Status: AC

## 2023-09-04 MED ORDER — HYDROMORPHONE HCL 1 MG/ML IJ SOLN
1.0000 mg | Freq: Once | INTRAMUSCULAR | Status: AC
  Administered 2023-09-04: 1 mg via INTRAVENOUS
  Filled 2023-09-04: qty 1

## 2023-09-04 MED ORDER — HYDROCODONE-ACETAMINOPHEN 5-325 MG PO TABS: 1.0000 | ORAL_TABLET | Freq: Four times a day (QID) | ORAL | 0 refills | Status: AC | PRN

## 2023-09-04 NOTE — Discharge Instructions (Addendum)
